# Patient Record
Sex: Female | Born: 1960
Health system: Southern US, Community
[De-identification: ages and names within clinical notes are randomized; demographics above are authoritative.]

---

## 1999-03-31 ENCOUNTER — Other Ambulatory Visit: Admission: RE | Admit: 1999-03-31 | Discharge: 1999-03-31 | Payer: Self-pay | Admitting: Obstetrics and Gynecology

## 2001-01-26 ENCOUNTER — Other Ambulatory Visit: Admission: RE | Admit: 2001-01-26 | Discharge: 2001-01-26 | Payer: Self-pay | Admitting: Obstetrics and Gynecology

## 2001-01-31 ENCOUNTER — Encounter: Admission: RE | Admit: 2001-01-31 | Discharge: 2001-01-31 | Payer: Self-pay | Admitting: Obstetrics and Gynecology

## 2001-01-31 ENCOUNTER — Encounter: Payer: Self-pay | Admitting: Obstetrics and Gynecology

## 2003-05-13 ENCOUNTER — Ambulatory Visit (HOSPITAL_COMMUNITY): Admission: RE | Admit: 2003-05-13 | Discharge: 2003-05-13 | Payer: Self-pay | Admitting: Neurosurgery

## 2005-10-18 ENCOUNTER — Ambulatory Visit: Payer: Self-pay | Admitting: Hematology and Oncology

## 2005-10-18 LAB — FERRITIN: Ferritin: 1 ng/mL — ABNORMAL LOW (ref 10–291)

## 2006-03-15 ENCOUNTER — Ambulatory Visit: Payer: Self-pay | Admitting: Hematology and Oncology

## 2006-03-15 LAB — CBC WITH DIFFERENTIAL/PLATELET
BASO%: 0.5 % (ref 0.0–2.0)
EOS%: 3 % (ref 0.0–7.0)
LYMPH%: 29.3 % (ref 14.0–48.0)
MCH: 29.5 pg (ref 26.0–34.0)
MCHC: 34.2 g/dL (ref 32.0–36.0)
MCV: 86.2 fL (ref 81.0–101.0)
MONO%: 6.8 % (ref 0.0–13.0)
Platelets: 400 10*3/uL (ref 145–400)
RBC: 4.82 10*6/uL (ref 3.70–5.32)

## 2006-08-10 ENCOUNTER — Ambulatory Visit (HOSPITAL_COMMUNITY): Admission: RE | Admit: 2006-08-10 | Discharge: 2006-08-11 | Payer: Self-pay | Admitting: Obstetrics and Gynecology

## 2006-08-10 ENCOUNTER — Encounter (INDEPENDENT_AMBULATORY_CARE_PROVIDER_SITE_OTHER): Payer: Self-pay | Admitting: *Deleted

## 2007-08-08 ENCOUNTER — Encounter: Admission: RE | Admit: 2007-08-08 | Discharge: 2007-08-08 | Payer: Self-pay | Admitting: Internal Medicine

## 2008-01-13 ENCOUNTER — Emergency Department (HOSPITAL_COMMUNITY): Admission: EM | Admit: 2008-01-13 | Discharge: 2008-01-13 | Payer: Self-pay | Admitting: Emergency Medicine

## 2008-03-26 ENCOUNTER — Ambulatory Visit (HOSPITAL_COMMUNITY): Admission: RE | Admit: 2008-03-26 | Discharge: 2008-03-26 | Payer: Self-pay | Admitting: Gastroenterology

## 2008-08-22 ENCOUNTER — Encounter: Admission: RE | Admit: 2008-08-22 | Discharge: 2008-08-22 | Payer: Self-pay | Admitting: Internal Medicine

## 2009-10-24 ENCOUNTER — Encounter: Admission: RE | Admit: 2009-10-24 | Discharge: 2009-10-24 | Payer: Self-pay | Admitting: Internal Medicine

## 2010-09-01 NOTE — Op Note (Signed)
[name], [name]                    ACCOUNT NO.:  [id number]   MEDICAL RECORD NO.:  [id number]          PATIENT TYPE:  AMB   LOCATION:  ENDO                         FACILITY:  [facility]   PHYSICIAN:  Bernette Redbird, M.D.   DATE OF BIRTH:  05/17/1960   DATE OF PROCEDURE:  03/26/2008  DATE OF DISCHARGE:                               OPERATIVE REPORT   PROCEDURE:  Colonoscopy.   INDICATIONS:  Initial colon cancer screening exam in a 50 year old Asian  immigrant with a family history of colon cancer in her sister.   FINDINGS:  Normal exam to the cecum.   DESCRIPTION OF PROCEDURE:  The nature, purpose, and risks of the  procedure had been discussed with the patient, and she provided written  consent, coming as an outpatient to the [facility].  Sedation was fentanyl 100 mcg and Versed 10 mg IV without clinical  instability.   The Pentax pediatric video colonoscope was easily advanced to the cecum,  as identified by visualization of the appendiceal orifice and the  ileocecal valve.  The tip of the scope was nubbed into the orifice of  the terminal ileum, although the TI was not freely intubated.  Pullback  was then performed.  The quality of prep was excellent and it is felt  that all areas were well-seen.   This was a normal examination.  No polyps, cancer, colitis, vascular  malformations, or diverticulosis were noted.  Retroflexion in the rectum  and re-inspection of the rectum were unremarkable.  No biopsies were  obtained.  The patient tolerated the procedure well and there were no  apparent complications.   IMPRESSION:  Normal initial screening colonoscopy in a patient with a  family history of colon cancer.   PLAN:  Repeat colonoscopy in five years.           ______________________________  Bernette Redbird, M.D.     RB/MEDQ  D:  03/26/2008  T:  03/26/2008  Job:  [id number]   cc:   Kari Baars, M.D.  Fax: [phone]

## 2010-09-04 NOTE — Op Note (Signed)
[name], [name]                    ACCOUNT NO.:  [id number]   MEDICAL RECORD NO.:  [id number]          PATIENT TYPE:  AMB   LOCATION:  SDC                           FACILITY:  [facility]   PHYSICIAN:  [name], M.D.DATE OF BIRTH:  [birth year]   DATE OF PROCEDURE:  08/10/2006  DATE OF DISCHARGE:                               OPERATIVE REPORT   PREOPERATIVE DIAGNOSIS:  1. Menorrhagia.  2. Fibroid uterus.   POSTOPERATIVE DIAGNOSIS:  1. Menorrhagia.  2  Fibroid uterus.  1. Endometriosis.   PROCEDURE:  Total abdominal hysterectomy.   SURGEON:  [name], M.D.   ASSISTANT:  [name]. Earlene Plater, M.D.   ANESTHESIA:  Epidural.   INDICATIONS:  This is a 50 year old, G1, P0-0-1-0 woman who has been  having excessively heavy menstrual periods.  The patient had an  ultrasound which revealed a 16-week size uterus.  The patient was put on  the Lupron to try to decrease the size of the uterus for 6 months.  The  patient's periods stopped.  The patient's uterus decreased slightly but  only to about 14-16 weeks size uterus.  The patient has multiple  fibroids present.  Because of the continued enlarged size of the uterus  and the location, it was decided instead of doing a laparoscopic  hysterectomy,  the patient will have a total abdominal hysterectomy.  Because of this, she was brought to the operating room for abdominal  hysterectomy.   FINDINGS:  Enlarged uterus with multiple fibroids present.  Normal  fallopian tubes and ovaries.  Endometriosis present in the anterior cul-  de-sac   DESCRIPTION OF PROCEDURE:  The patient is brought into the operating  room and given adequate epidural anesthesia.  She was placed in a frog-  leg position.  Her abdomen and vagina were washed with Betadine.  Foley  catheter was inserted into the bladder.  The patient was taken out of  frog-leg position.  She was draped in sterile fashion.  After  infiltrating with 0.5% Marcaine, incision was  made.  This was brought  down sharply to the fascia.  Bleeders were cauterized.  Fascia was  incised sharply and with cautery, fascia was elevated off of the rectus  muscles and was cauterized.  The rectus muscles were separated bluntly.  The perineum was identified and entered sharply.  The peritoneal  incision was extended both bluntly and sharply.  The bowel was packed  off with wet lap pads.  The fibroids were inspected.  The left round  ligament was identified.  It was doubly suture ligated with 0 Vicryl  ligature and incised.  The anterior leaf of the broad ligament was  incised.  The left utero-ovarian ligament was doubly clamped.  It was  suture ligated with 0 Vicryl ligature and then free tied with 0 Vicryl  free ties.  The same procedure was performed on the right.  However,  because of the anatomy the utero-ovarian ligament was clamped with a  LigaSure and cauterized to be able to take it down instead of clamping  and using  ligatures. Adequate hemostasis was present.  The uterus was  mobilized in this fashion and using a corkscrew, uterus was brought up  through the abdominal incision after having placed a Balfour retractor.  The bladder flap was developed off of the lower uterine segment with  blunt and sharp dissection and with cautery.  Adequate hemostasis was  present.  The bladder flap was dissected off the cervix with blunt  dissection.  The uterine arteries were identified and they were clamped  and cut and suture ligated with 0 Vicryl ligatures.  This was done down  to the cardinal ligaments and the uterus was dissected off of the cervix  for more mobilization and for better visualization.  This was done with  sharp scalpel.  The cervix was grasped with Kocher clamps.  The  remaining cervix was dissected free by using straight larger clamps  across the cardinal ligaments by clamping, cutting, and suture ligating  on alternating sides.  Suture ligatures were with 0  Vicryl.  The  uterosacral ligaments were clamped, cut and suture ligated.  The vagina  was entered posteriorly with a curved clamp and the cervix was dissected  off of the vagina circumferentially.  The vagina was identified and  using 0 Vicryl, a running locked whipstitch was taken with 0 Vicryl.  Adequate hemostasis was obtained in this manner.  The vagina was then  closed with 0 Vicryl figure-of-eight stitches.  Adequate hemostasis was  present.  The pelvis was then irrigated.  There was no bleeding present.  The ovaries were inspected and felt to have adequate hemostasis.  There  was no bleeding beneath the bladder flap.  All packs were removed from  the abdomen.  The small bleeders were cauterized underneath the fascia.  There was no significant bleeding here.  The fascia was then closed with  two sutures with 0 Vicryl running laterally to the midline.  Adequate  hemostasis was present.  The incision was irrigated.  The skin was then  closed with 4-0 Monocryl in a subcuticular running stitch.  The incision  had been infiltrated with more of the 0.5% Marcaine prior to closure.  Adequate hemostasis was present.  The skin was also closed with  Dermabond.  No dressing was necessary because of the Dermabond closure.  The patient was then awakened.  She was moved from the operating table  to a stretcher in stable condition.  Complications were none.  Estimated  blood loss was 150 mL.  Specimen was the uterus with fibroids and cervix  present, weight approximately 550 grams.      [name], M.D.  Electronically Signed     SAD/MEDQ  D:  08/10/2006  T:  08/10/2006  Job:  [id number]

## 2010-09-28 ENCOUNTER — Other Ambulatory Visit: Payer: Self-pay | Admitting: Internal Medicine

## 2010-09-28 DIAGNOSIS — Z1231 Encounter for screening mammogram for malignant neoplasm of breast: Secondary | ICD-10-CM

## 2010-10-26 ENCOUNTER — Ambulatory Visit
Admission: RE | Admit: 2010-10-26 | Discharge: 2010-10-26 | Disposition: A | Discharge status: Home / Self Care | Payer: 59 | Source: Ambulatory Visit | Attending: Internal Medicine | Admitting: Internal Medicine

## 2010-10-26 DIAGNOSIS — Z1231 Encounter for screening mammogram for malignant neoplasm of breast: Secondary | ICD-10-CM

## 2011-10-05 ENCOUNTER — Other Ambulatory Visit: Payer: Self-pay | Admitting: Internal Medicine

## 2011-10-05 DIAGNOSIS — Z1231 Encounter for screening mammogram for malignant neoplasm of breast: Secondary | ICD-10-CM

## 2011-11-19 ENCOUNTER — Ambulatory Visit: Payer: 59

## 2011-12-14 ENCOUNTER — Ambulatory Visit
Admission: RE | Admit: 2011-12-14 | Discharge: 2011-12-14 | Disposition: A | Discharge status: Home / Self Care | Payer: 59 | Source: Ambulatory Visit | Attending: Internal Medicine | Admitting: Internal Medicine

## 2011-12-14 DIAGNOSIS — Z1231 Encounter for screening mammogram for malignant neoplasm of breast: Secondary | ICD-10-CM

## 2012-03-20 ENCOUNTER — Other Ambulatory Visit: Payer: Self-pay | Admitting: Hematology & Oncology

## 2012-11-10 ENCOUNTER — Other Ambulatory Visit: Payer: Self-pay

## 2012-11-10 DIAGNOSIS — Z1231 Encounter for screening mammogram for malignant neoplasm of breast: Secondary | ICD-10-CM

## 2013-01-03 ENCOUNTER — Ambulatory Visit
Admission: RE | Admit: 2013-01-03 | Discharge: 2013-01-03 | Disposition: A | Discharge status: Home / Self Care | Payer: 59 | Source: Ambulatory Visit

## 2013-01-03 DIAGNOSIS — Z1231 Encounter for screening mammogram for malignant neoplasm of breast: Secondary | ICD-10-CM

## 2013-04-10 ENCOUNTER — Other Ambulatory Visit: Payer: Self-pay | Admitting: Hematology & Oncology

## 2014-01-07 ENCOUNTER — Other Ambulatory Visit: Payer: Self-pay

## 2014-01-07 DIAGNOSIS — Z1231 Encounter for screening mammogram for malignant neoplasm of breast: Secondary | ICD-10-CM

## 2014-01-16 ENCOUNTER — Ambulatory Visit
Admission: RE | Admit: 2014-01-16 | Discharge: 2014-01-16 | Disposition: A | Discharge status: Home / Self Care | Payer: 59 | Source: Ambulatory Visit

## 2014-01-16 DIAGNOSIS — Z1231 Encounter for screening mammogram for malignant neoplasm of breast: Secondary | ICD-10-CM

## 2014-06-17 ENCOUNTER — Other Ambulatory Visit: Payer: Self-pay | Admitting: Hematology & Oncology

## 2015-01-29 ENCOUNTER — Other Ambulatory Visit: Payer: Self-pay

## 2015-01-29 DIAGNOSIS — Z1231 Encounter for screening mammogram for malignant neoplasm of breast: Secondary | ICD-10-CM

## 2015-02-11 ENCOUNTER — Ambulatory Visit
Admission: RE | Admit: 2015-02-11 | Discharge: 2015-02-11 | Disposition: A | Discharge status: Home / Self Care | Payer: 59 | Source: Ambulatory Visit

## 2015-02-11 DIAGNOSIS — Z1231 Encounter for screening mammogram for malignant neoplasm of breast: Secondary | ICD-10-CM

## 2015-03-31 ENCOUNTER — Ambulatory Visit (INDEPENDENT_AMBULATORY_CARE_PROVIDER_SITE_OTHER): Payer: 59 | Admitting: Podiatry

## 2015-03-31 VITALS — BP 101/70 | HR 93 | Resp 16

## 2015-03-31 DIAGNOSIS — B351 Tinea unguium: Secondary | ICD-10-CM | POA: Diagnosis not present

## 2015-03-31 DIAGNOSIS — L600 Ingrowing nail: Secondary | ICD-10-CM

## 2015-03-31 NOTE — Progress Notes (Signed)
   Subjective:    Patient ID: [name], female    DOB: 08/19/1960, 54 y.o.   MRN: [id number]  HPI  Pt presents with left hallux nail discoloration, no pain to area  Review of Systems  All other systems reviewed and are negative.      Objective:   Physical Exam        Assessment & Plan:

## 2015-04-01 NOTE — Progress Notes (Signed)
Subjective:     Patient ID: [name], female   DOB: 03/30/1961, 54 y.o.   MRN: [id number]  HPI patient presents with discolored loose left hallux nail stating she dropped a 40 pound bag of dog food on her   Review of Systems  All other systems reviewed and are negative.      Objective:   Physical Exam  Constitutional: She is oriented to person, place, and time.  Cardiovascular: Intact distal pulses.   Musculoskeletal: Normal range of motion.  Neurological: She is oriented to person, place, and time.  Skin: Skin is warm.  Nursing note and vitals reviewed.  neurovascular status found to be intact muscle strength adequate range of motion within normal limits with patient noted to have a completely discolored left big toenail that is mildly loose distally but intact proximally with minimal discomfort and no drainage. Good digital perfusion is noted well oriented 3     Assessment:     Trauma to the left hallux nail secondary to probable direct pressure creating discoloration and underlying bleeding of the nail    Plan:     H&P condition discussed and explained possibility for nail removal in future and the fact it may not regrow normally and may develop ingrown component. Patient will be seen back for us to recheck

## 2015-06-12 MED FILL — SULINDAC 200 MG TABLET: 200 | 30 days supply | Qty: 60 | Fill #5

## 2015-08-06 ENCOUNTER — Other Ambulatory Visit: Payer: Self-pay | Admitting: Hematology & Oncology

## 2015-08-06 DIAGNOSIS — E559 Vitamin D deficiency, unspecified: Secondary | ICD-10-CM | POA: Diagnosis not present

## 2015-08-06 DIAGNOSIS — Z0000 Encounter for general adult medical examination without abnormal findings: Secondary | ICD-10-CM | POA: Diagnosis not present

## 2015-08-06 DIAGNOSIS — R7301 Impaired fasting glucose: Secondary | ICD-10-CM | POA: Diagnosis not present

## 2015-08-06 DIAGNOSIS — E784 Other hyperlipidemia: Secondary | ICD-10-CM | POA: Diagnosis not present

## 2015-08-06 MED FILL — SULINDAC 200 MG TABLET: 200 | 30 days supply | Qty: 60 | Fill #0

## 2015-08-13 DIAGNOSIS — J3089 Other allergic rhinitis: Secondary | ICD-10-CM | POA: Diagnosis not present

## 2015-08-13 DIAGNOSIS — Z01419 Encounter for gynecological examination (general) (routine) without abnormal findings: Secondary | ICD-10-CM | POA: Diagnosis not present

## 2015-08-13 DIAGNOSIS — Z1389 Encounter for screening for other disorder: Secondary | ICD-10-CM | POA: Diagnosis not present

## 2015-08-13 DIAGNOSIS — Z0000 Encounter for general adult medical examination without abnormal findings: Secondary | ICD-10-CM | POA: Diagnosis not present

## 2015-08-13 DIAGNOSIS — E784 Other hyperlipidemia: Secondary | ICD-10-CM | POA: Diagnosis not present

## 2015-08-13 DIAGNOSIS — E559 Vitamin D deficiency, unspecified: Secondary | ICD-10-CM | POA: Diagnosis not present

## 2015-08-13 DIAGNOSIS — M19041 Primary osteoarthritis, right hand: Secondary | ICD-10-CM | POA: Diagnosis not present

## 2015-08-13 DIAGNOSIS — R7301 Impaired fasting glucose: Secondary | ICD-10-CM | POA: Diagnosis not present

## 2015-08-13 DIAGNOSIS — R030 Elevated blood-pressure reading, without diagnosis of hypertension: Secondary | ICD-10-CM | POA: Diagnosis not present

## 2015-08-13 DIAGNOSIS — Z800 Family history of malignant neoplasm of digestive organs: Secondary | ICD-10-CM | POA: Diagnosis not present

## 2015-08-13 MED FILL — HYDROCODONE-CHLORPHENIRAM S: 10-8 | 30 days supply | Qty: 150 | Fill #0

## 2015-11-09 MED FILL — SULINDAC 200 MG TABLET: 200 | 30 days supply | Qty: 60 | Fill #1

## 2016-02-04 ENCOUNTER — Other Ambulatory Visit: Payer: Self-pay | Admitting: Internal Medicine

## 2016-02-04 DIAGNOSIS — Z1231 Encounter for screening mammogram for malignant neoplasm of breast: Secondary | ICD-10-CM

## 2016-02-09 MED FILL — SULINDAC 200 MG TABLET: 200 | 30 days supply | Qty: 60 | Fill #2

## 2016-02-16 ENCOUNTER — Other Ambulatory Visit: Payer: Self-pay

## 2016-02-17 DIAGNOSIS — R0789 Other chest pain: Secondary | ICD-10-CM | POA: Diagnosis not present

## 2016-02-17 DIAGNOSIS — M19041 Primary osteoarthritis, right hand: Secondary | ICD-10-CM | POA: Diagnosis not present

## 2016-02-17 DIAGNOSIS — R079 Chest pain, unspecified: Secondary | ICD-10-CM | POA: Diagnosis not present

## 2016-02-17 DIAGNOSIS — Z6828 Body mass index (BMI) 28.0-28.9, adult: Secondary | ICD-10-CM | POA: Diagnosis not present

## 2016-02-27 ENCOUNTER — Ambulatory Visit
Admission: RE | Admit: 2016-02-27 | Discharge: 2016-02-27 | Disposition: A | Discharge status: Home / Self Care | Payer: 59 | Source: Ambulatory Visit | Attending: Internal Medicine | Admitting: Internal Medicine

## 2016-02-27 DIAGNOSIS — Z1231 Encounter for screening mammogram for malignant neoplasm of breast: Secondary | ICD-10-CM | POA: Diagnosis not present

## 2016-04-02 ENCOUNTER — Encounter: Payer: Self-pay | Admitting: *Deleted

## 2016-04-23 MED FILL — SULINDAC 200 MG TABLET: 200 | 30 days supply | Qty: 60 | Fill #3

## 2016-07-10 MED FILL — SULINDAC 200 MG TABLET: 200 | 30 days supply | Qty: 60 | Fill #4

## 2016-07-19 DIAGNOSIS — H52203 Unspecified astigmatism, bilateral: Secondary | ICD-10-CM | POA: Diagnosis not present

## 2016-08-11 DIAGNOSIS — R7301 Impaired fasting glucose: Secondary | ICD-10-CM | POA: Diagnosis not present

## 2016-08-11 DIAGNOSIS — E559 Vitamin D deficiency, unspecified: Secondary | ICD-10-CM | POA: Diagnosis not present

## 2016-08-11 DIAGNOSIS — Z0000 Encounter for general adult medical examination without abnormal findings: Secondary | ICD-10-CM | POA: Diagnosis not present

## 2016-08-11 DIAGNOSIS — N390 Urinary tract infection, site not specified: Secondary | ICD-10-CM | POA: Diagnosis not present

## 2016-08-18 DIAGNOSIS — R030 Elevated blood-pressure reading, without diagnosis of hypertension: Secondary | ICD-10-CM | POA: Diagnosis not present

## 2016-08-18 DIAGNOSIS — J3089 Other allergic rhinitis: Secondary | ICD-10-CM | POA: Diagnosis not present

## 2016-08-18 DIAGNOSIS — Z800 Family history of malignant neoplasm of digestive organs: Secondary | ICD-10-CM | POA: Diagnosis not present

## 2016-08-18 DIAGNOSIS — Z1389 Encounter for screening for other disorder: Secondary | ICD-10-CM | POA: Diagnosis not present

## 2016-08-18 DIAGNOSIS — Z6829 Body mass index (BMI) 29.0-29.9, adult: Secondary | ICD-10-CM | POA: Diagnosis not present

## 2016-08-18 DIAGNOSIS — E784 Other hyperlipidemia: Secondary | ICD-10-CM | POA: Diagnosis not present

## 2016-08-18 DIAGNOSIS — Z01419 Encounter for gynecological examination (general) (routine) without abnormal findings: Secondary | ICD-10-CM | POA: Diagnosis not present

## 2016-08-18 DIAGNOSIS — M19041 Primary osteoarthritis, right hand: Secondary | ICD-10-CM | POA: Diagnosis not present

## 2016-08-18 DIAGNOSIS — E559 Vitamin D deficiency, unspecified: Secondary | ICD-10-CM | POA: Diagnosis not present

## 2016-08-18 DIAGNOSIS — R7301 Impaired fasting glucose: Secondary | ICD-10-CM | POA: Diagnosis not present

## 2016-08-18 DIAGNOSIS — Z0000 Encounter for general adult medical examination without abnormal findings: Secondary | ICD-10-CM | POA: Diagnosis not present

## 2016-09-01 ENCOUNTER — Other Ambulatory Visit: Payer: Self-pay | Admitting: Hematology & Oncology

## 2016-09-01 MED FILL — SULINDAC 200 MG TABLET: 200 | 30 days supply | Qty: 60 | Fill #0

## 2016-11-19 ENCOUNTER — Other Ambulatory Visit: Payer: Self-pay | Admitting: Internal Medicine

## 2016-11-19 DIAGNOSIS — Z1231 Encounter for screening mammogram for malignant neoplasm of breast: Secondary | ICD-10-CM

## 2016-12-21 MED FILL — SULINDAC 200 MG TABLET: 200 | 30 days supply | Qty: 60 | Fill #1

## 2017-02-17 DIAGNOSIS — Z6828 Body mass index (BMI) 28.0-28.9, adult: Secondary | ICD-10-CM | POA: Diagnosis not present

## 2017-02-17 DIAGNOSIS — J069 Acute upper respiratory infection, unspecified: Secondary | ICD-10-CM | POA: Diagnosis not present

## 2017-02-17 DIAGNOSIS — R05 Cough: Secondary | ICD-10-CM | POA: Diagnosis not present

## 2017-02-17 MED FILL — AZITHROMYCIN 250 MG TAB: 250 | 5 days supply | Qty: 6 | Fill #0

## 2017-02-17 MED FILL — HYDROCODONE-CHLORPHENIRAM S: 10-8 | 12 days supply | Qty: 120 | Fill #0

## 2017-03-04 ENCOUNTER — Ambulatory Visit
Admission: RE | Admit: 2017-03-04 | Discharge: 2017-03-04 | Disposition: A | Discharge status: Home / Self Care | Payer: 59 | Source: Ambulatory Visit | Attending: Internal Medicine | Admitting: Internal Medicine

## 2017-03-04 DIAGNOSIS — Z1231 Encounter for screening mammogram for malignant neoplasm of breast: Secondary | ICD-10-CM | POA: Diagnosis not present

## 2017-03-08 MED FILL — SULINDAC 200 MG TABLET: 200 | 30 days supply | Qty: 60 | Fill #2

## 2017-05-13 MED FILL — SULINDAC 200 MG TABLET: 200 | 30 days supply | Qty: 60 | Fill #3

## 2017-07-19 MED FILL — SULINDAC 200 MG TABLET: 200 | 30 days supply | Qty: 60 | Fill #4

## 2017-08-24 DIAGNOSIS — R7301 Impaired fasting glucose: Secondary | ICD-10-CM | POA: Diagnosis not present

## 2017-08-24 DIAGNOSIS — Z0000 Encounter for general adult medical examination without abnormal findings: Secondary | ICD-10-CM | POA: Diagnosis not present

## 2017-08-24 DIAGNOSIS — E559 Vitamin D deficiency, unspecified: Secondary | ICD-10-CM | POA: Diagnosis not present

## 2017-08-24 DIAGNOSIS — R82998 Other abnormal findings in urine: Secondary | ICD-10-CM | POA: Diagnosis not present

## 2017-08-31 DIAGNOSIS — R030 Elevated blood-pressure reading, without diagnosis of hypertension: Secondary | ICD-10-CM | POA: Diagnosis not present

## 2017-08-31 DIAGNOSIS — E7849 Other hyperlipidemia: Secondary | ICD-10-CM | POA: Diagnosis not present

## 2017-08-31 DIAGNOSIS — Z800 Family history of malignant neoplasm of digestive organs: Secondary | ICD-10-CM | POA: Diagnosis not present

## 2017-08-31 DIAGNOSIS — J309 Allergic rhinitis, unspecified: Secondary | ICD-10-CM | POA: Diagnosis not present

## 2017-08-31 DIAGNOSIS — Z6829 Body mass index (BMI) 29.0-29.9, adult: Secondary | ICD-10-CM | POA: Diagnosis not present

## 2017-08-31 DIAGNOSIS — M19041 Primary osteoarthritis, right hand: Secondary | ICD-10-CM | POA: Diagnosis not present

## 2017-08-31 DIAGNOSIS — E559 Vitamin D deficiency, unspecified: Secondary | ICD-10-CM | POA: Diagnosis not present

## 2017-08-31 DIAGNOSIS — Z1151 Encounter for screening for human papillomavirus (HPV): Secondary | ICD-10-CM | POA: Diagnosis not present

## 2017-08-31 DIAGNOSIS — R7301 Impaired fasting glucose: Secondary | ICD-10-CM | POA: Diagnosis not present

## 2017-08-31 DIAGNOSIS — Z01419 Encounter for gynecological examination (general) (routine) without abnormal findings: Secondary | ICD-10-CM | POA: Diagnosis not present

## 2017-08-31 DIAGNOSIS — Z1389 Encounter for screening for other disorder: Secondary | ICD-10-CM | POA: Diagnosis not present

## 2017-08-31 DIAGNOSIS — Z0000 Encounter for general adult medical examination without abnormal findings: Secondary | ICD-10-CM | POA: Diagnosis not present

## 2017-08-31 MED FILL — SHINGRIX 50 MCG SUS: 50 | 1 days supply | Qty: 1 | Fill #0

## 2017-10-18 ENCOUNTER — Other Ambulatory Visit: Payer: Self-pay | Admitting: Hematology & Oncology

## 2017-10-25 MED FILL — SULINDAC TAB 200 MG: 200 | 30 days supply | Qty: 60 | Fill #0

## 2017-11-21 MED FILL — SULINDAC TAB 200 MG: 200 | 30 days supply | Qty: 60 | Fill #1

## 2017-11-21 MED FILL — SHINGRIX 50 MCG SUS: 50 | 1 days supply | Qty: 1 | Fill #1

## 2018-02-15 ENCOUNTER — Other Ambulatory Visit: Payer: Self-pay | Admitting: Internal Medicine

## 2018-02-15 DIAGNOSIS — Z1231 Encounter for screening mammogram for malignant neoplasm of breast: Secondary | ICD-10-CM

## 2018-03-01 MED FILL — SULINDAC 200 MG TABS: 200 | 30 days supply | Qty: 60 | Fill #2

## 2018-04-02 MED FILL — SULINDAC 200 MG TABS: 200 | 30 days supply | Qty: 60 | Fill #3

## 2018-04-04 ENCOUNTER — Ambulatory Visit
Admission: RE | Admit: 2018-04-04 | Discharge: 2018-04-04 | Disposition: A | Discharge status: Home / Self Care | Payer: 59 | Source: Ambulatory Visit | Attending: Internal Medicine | Admitting: Internal Medicine

## 2018-04-04 DIAGNOSIS — Z1231 Encounter for screening mammogram for malignant neoplasm of breast: Secondary | ICD-10-CM

## 2018-06-29 MED FILL — SULINDAC 200 MG TABS: 200 | 30 days supply | Qty: 60 | Fill #4

## 2018-08-31 DIAGNOSIS — E559 Vitamin D deficiency, unspecified: Secondary | ICD-10-CM | POA: Diagnosis not present

## 2018-08-31 DIAGNOSIS — E785 Hyperlipidemia, unspecified: Secondary | ICD-10-CM | POA: Diagnosis not present

## 2018-08-31 DIAGNOSIS — Z008 Encounter for other general examination: Secondary | ICD-10-CM | POA: Diagnosis not present

## 2018-08-31 DIAGNOSIS — R7301 Impaired fasting glucose: Secondary | ICD-10-CM | POA: Diagnosis not present

## 2018-08-31 DIAGNOSIS — R030 Elevated blood-pressure reading, without diagnosis of hypertension: Secondary | ICD-10-CM | POA: Diagnosis not present

## 2018-09-08 ENCOUNTER — Other Ambulatory Visit: Payer: Self-pay | Admitting: Internal Medicine

## 2018-09-08 DIAGNOSIS — Z1331 Encounter for screening for depression: Secondary | ICD-10-CM | POA: Diagnosis not present

## 2018-09-08 DIAGNOSIS — Z800 Family history of malignant neoplasm of digestive organs: Secondary | ICD-10-CM | POA: Diagnosis not present

## 2018-09-08 DIAGNOSIS — Z0000 Encounter for general adult medical examination without abnormal findings: Secondary | ICD-10-CM | POA: Diagnosis not present

## 2018-09-08 DIAGNOSIS — E559 Vitamin D deficiency, unspecified: Secondary | ICD-10-CM | POA: Diagnosis not present

## 2018-09-08 DIAGNOSIS — M19041 Primary osteoarthritis, right hand: Secondary | ICD-10-CM | POA: Diagnosis not present

## 2018-09-08 DIAGNOSIS — J309 Allergic rhinitis, unspecified: Secondary | ICD-10-CM | POA: Diagnosis not present

## 2018-09-08 DIAGNOSIS — R7301 Impaired fasting glucose: Secondary | ICD-10-CM | POA: Diagnosis not present

## 2018-09-08 DIAGNOSIS — E785 Hyperlipidemia, unspecified: Secondary | ICD-10-CM | POA: Diagnosis not present

## 2018-09-08 DIAGNOSIS — R030 Elevated blood-pressure reading, without diagnosis of hypertension: Secondary | ICD-10-CM | POA: Diagnosis not present

## 2018-09-08 DIAGNOSIS — Z1231 Encounter for screening mammogram for malignant neoplasm of breast: Secondary | ICD-10-CM

## 2018-09-27 MED FILL — SULINDAC 200 MG TABS: 200 | 30 days supply | Qty: 60 | Fill #5

## 2018-09-29 DIAGNOSIS — Z01419 Encounter for gynecological examination (general) (routine) without abnormal findings: Secondary | ICD-10-CM | POA: Diagnosis not present

## 2018-09-29 DIAGNOSIS — Z1151 Encounter for screening for human papillomavirus (HPV): Secondary | ICD-10-CM | POA: Diagnosis not present

## 2018-09-29 DIAGNOSIS — Z6827 Body mass index (BMI) 27.0-27.9, adult: Secondary | ICD-10-CM | POA: Diagnosis not present

## 2018-10-27 DIAGNOSIS — H5203 Hypermetropia, bilateral: Secondary | ICD-10-CM | POA: Diagnosis not present

## 2018-12-12 MED FILL — SULINDAC 200 MG TABS: 200 | 30 days supply | Qty: 60 | Fill #0

## 2018-12-20 MED FILL — SULINDAC 200 MG TABS: 200 | 30 days supply | Qty: 60 | Fill #0

## 2019-03-05 MED FILL — SULINDAC 200 MG TABS: 200 | 30 days supply | Qty: 60 | Fill #1

## 2019-04-06 ENCOUNTER — Other Ambulatory Visit: Payer: Self-pay

## 2019-04-06 ENCOUNTER — Ambulatory Visit
Admission: RE | Admit: 2019-04-06 | Discharge: 2019-04-06 | Disposition: A | Discharge status: Home / Self Care | Payer: 59 | Source: Ambulatory Visit | Attending: Internal Medicine | Admitting: Internal Medicine

## 2019-04-06 DIAGNOSIS — Z1231 Encounter for screening mammogram for malignant neoplasm of breast: Secondary | ICD-10-CM | POA: Diagnosis not present

## 2019-04-10 DIAGNOSIS — W5503XA Scratched by cat, initial encounter: Secondary | ICD-10-CM | POA: Diagnosis not present

## 2019-04-10 DIAGNOSIS — R229 Localized swelling, mass and lump, unspecified: Secondary | ICD-10-CM | POA: Diagnosis not present

## 2019-04-10 MED FILL — MUPIROCIN 2% OINTMENT: 2 | 7 days supply | Qty: 22 | Fill #0

## 2019-04-10 MED FILL — AMOX-CLAV 875-125 MG TABLET: 875-125 | 7 days supply | Qty: 14 | Fill #0

## 2019-07-13 DIAGNOSIS — Z1159 Encounter for screening for other viral diseases: Secondary | ICD-10-CM | POA: Diagnosis not present

## 2019-07-13 MED FILL — PEG-3350 SOLUTION: 420 | 1 days supply | Qty: 4000 | Fill #0

## 2019-07-16 DIAGNOSIS — M8589 Other specified disorders of bone density and structure, multiple sites: Secondary | ICD-10-CM | POA: Diagnosis not present

## 2019-07-18 DIAGNOSIS — K635 Polyp of colon: Secondary | ICD-10-CM | POA: Diagnosis not present

## 2019-07-18 DIAGNOSIS — Z800 Family history of malignant neoplasm of digestive organs: Secondary | ICD-10-CM | POA: Diagnosis not present

## 2019-07-18 DIAGNOSIS — Z86010 Personal history of colonic polyps: Secondary | ICD-10-CM | POA: Diagnosis not present

## 2019-07-30 MED FILL — SULINDAC 200 MG TABLET: 200 | 30 days supply | Qty: 60 | Fill #2

## 2020-03-03 ENCOUNTER — Other Ambulatory Visit: Payer: Self-pay | Admitting: Internal Medicine

## 2020-03-03 DIAGNOSIS — Z1231 Encounter for screening mammogram for malignant neoplasm of breast: Secondary | ICD-10-CM

## 2020-05-23 ENCOUNTER — Ambulatory Visit: Payer: 59

## 2020-05-30 ENCOUNTER — Ambulatory Visit
Admission: RE | Admit: 2020-05-30 | Discharge: 2020-05-30 | Disposition: A | Discharge status: Home / Self Care | Payer: BC Managed Care – PPO | Source: Ambulatory Visit | Attending: Internal Medicine | Admitting: Internal Medicine

## 2020-05-30 ENCOUNTER — Other Ambulatory Visit: Payer: Self-pay

## 2020-05-30 DIAGNOSIS — Z1231 Encounter for screening mammogram for malignant neoplasm of breast: Secondary | ICD-10-CM

## 2020-12-25 IMAGING — MG DIGITAL SCREENING BILAT W/ TOMO W/ CAD
8 series · 9 of 24 positions shown · non-contrast
Comparison: Previous exam(s).

CLINICAL DATA: Screening.

EXAM:
DIGITAL SCREENING BILATERAL MAMMOGRAM WITH TOMO AND CAD

[L CC synth-2D]
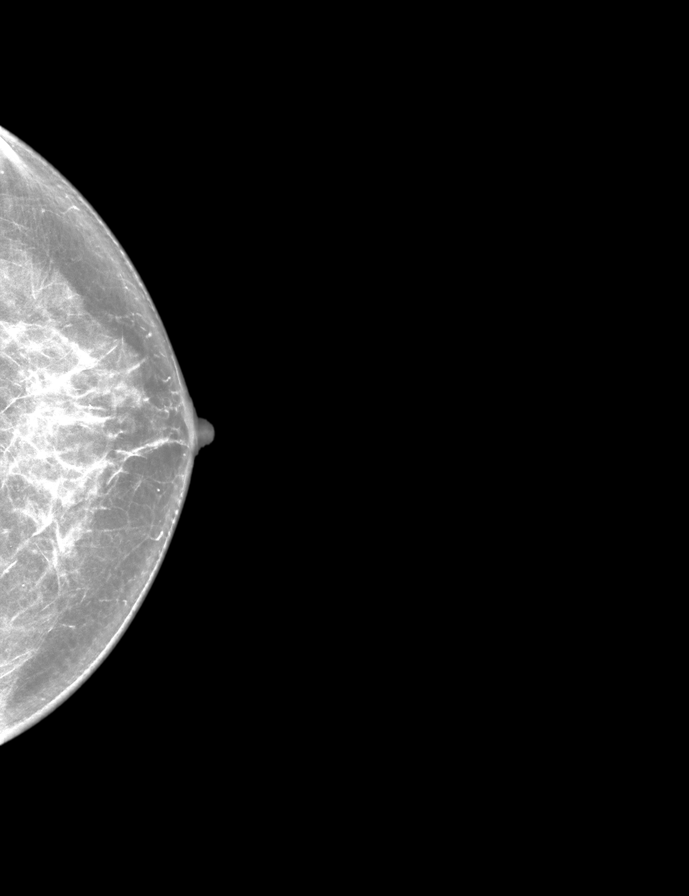

[R MLO synth-2D]
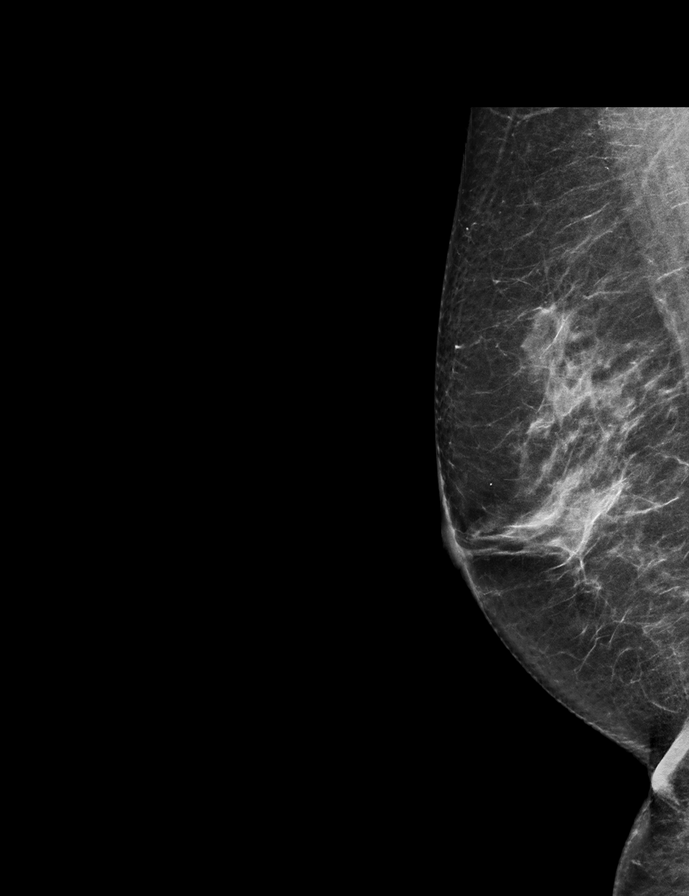

[L MLO synth-2D]
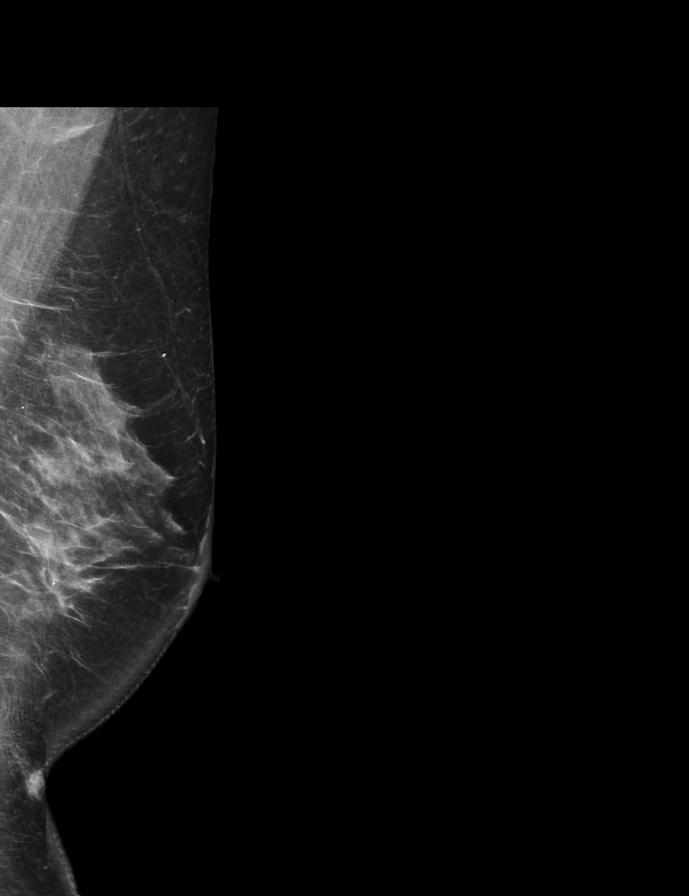

[R CC synth-2D]
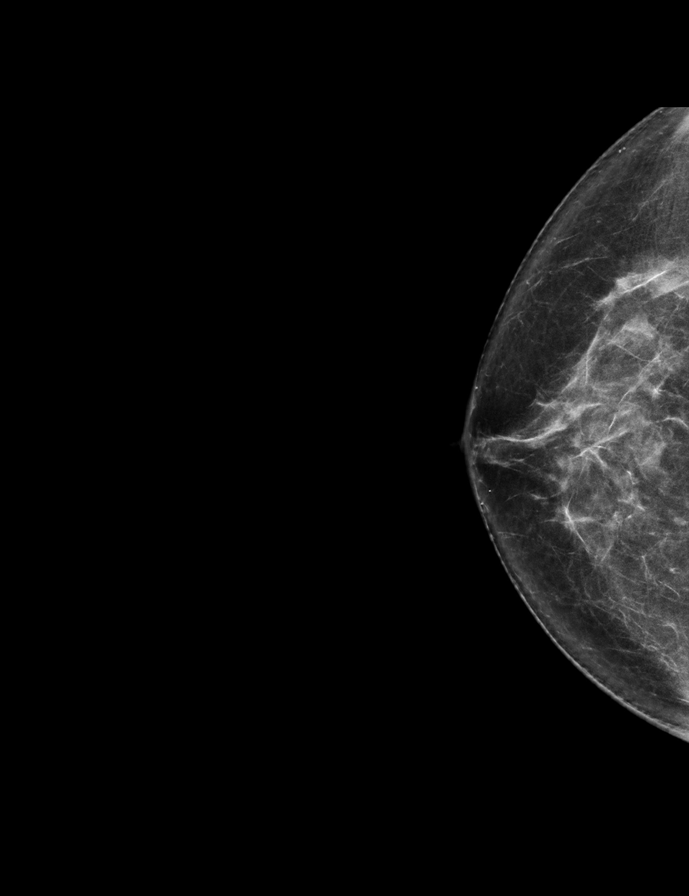

[R CC tomo · 2 of 72 frames shown]
[frame 24/72]
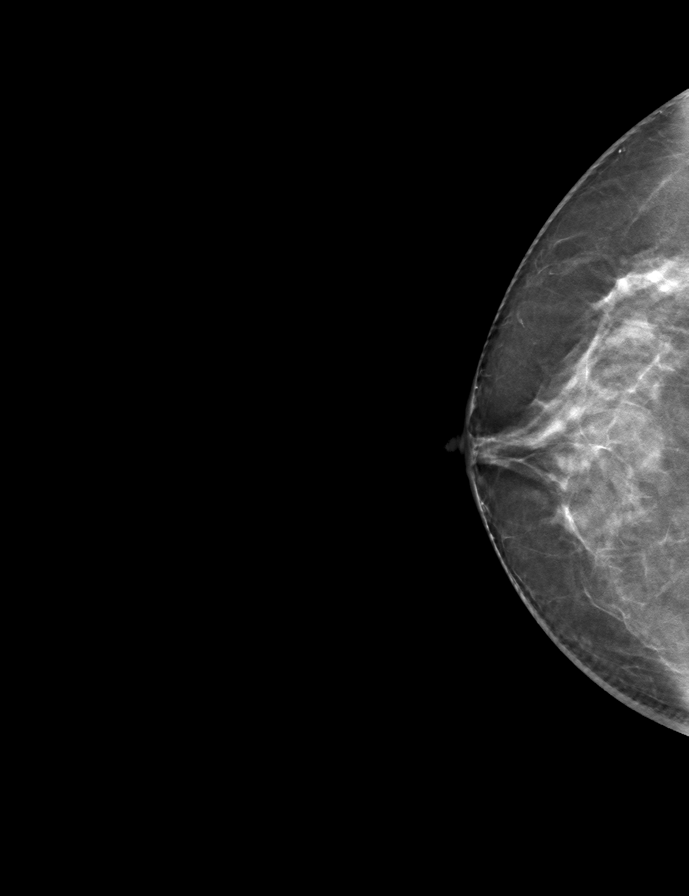
[frame 37/72]
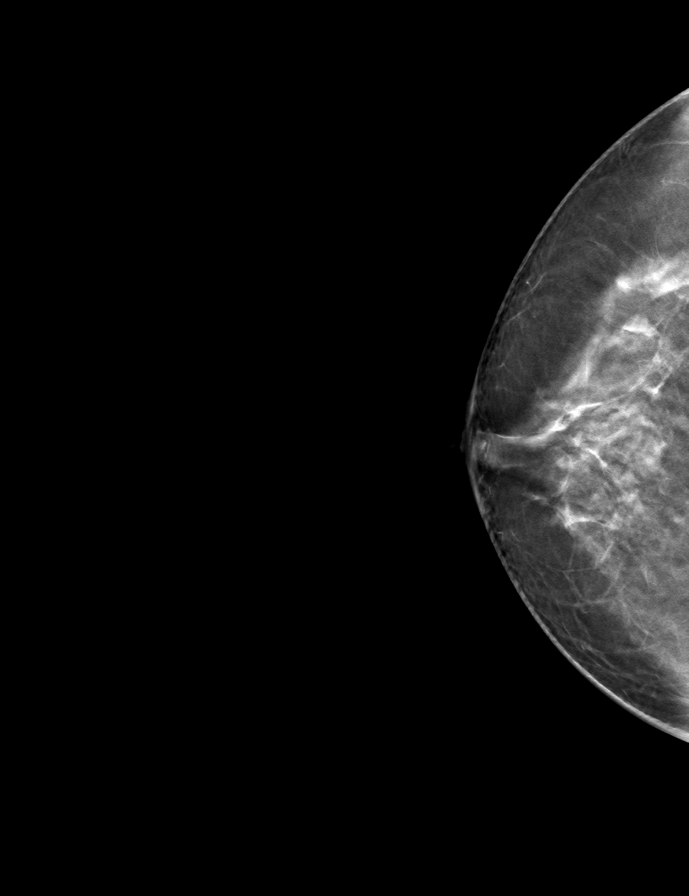

[R MLO tomo · tomo slice 33/66.0]
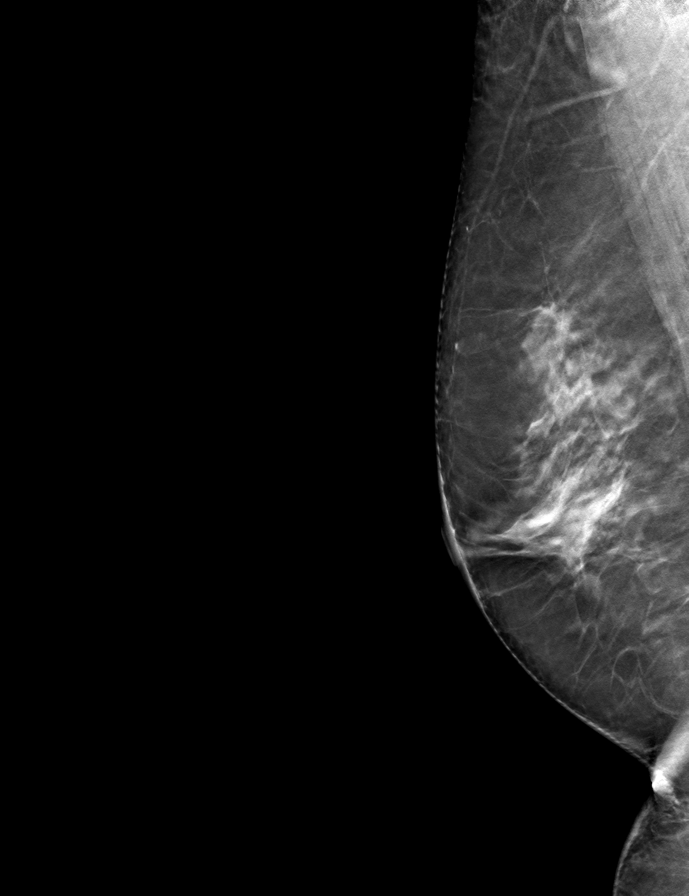

[L CC tomo · tomo slice 33/65.0]
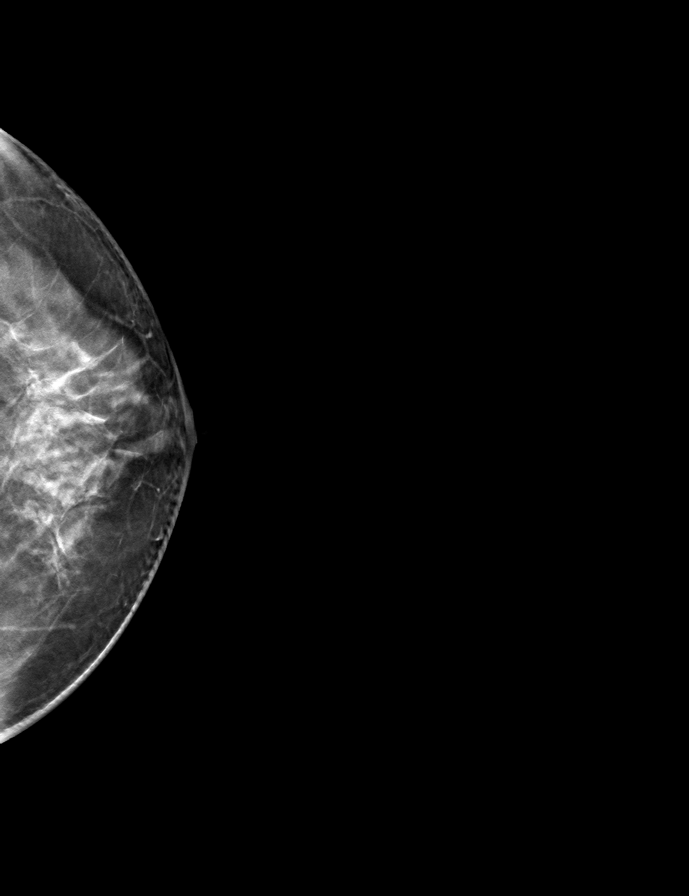

[L MLO tomo · tomo slice 38/75.0]
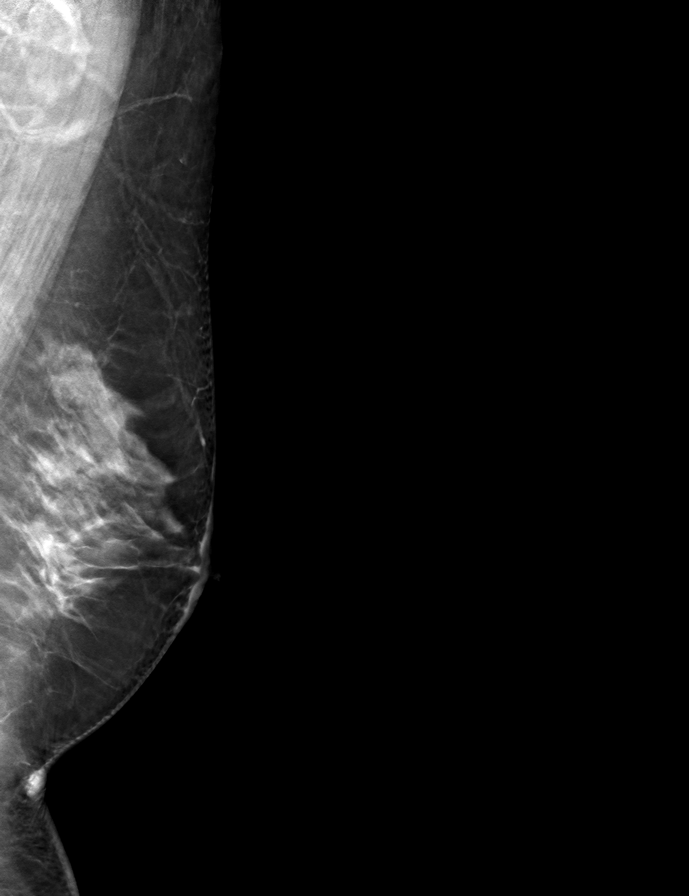

[9 of 24 positions shown; findings below may reference images not displayed]

ACR Breast Density Category c: The breast tissue is heterogeneously
dense, which may obscure small masses.
FINDINGS: There are no findings suspicious for malignancy. Images were
processed with CAD.
IMPRESSION: No mammographic evidence of malignancy. A result letter of this
screening mammogram will be mailed directly to the patient.

RECOMMENDATION:
Screening mammogram in one year. (Code:FT-U-LHB)

BI-RADS CATEGORY  1: Negative.

## 2021-01-05 ENCOUNTER — Other Ambulatory Visit (HOSPITAL_COMMUNITY): Payer: Self-pay

## 2021-01-05 MED ORDER — HYDROCOD POLST-CPM POLST ER 10-8 MG/5ML PO SUER
ORAL | 0 refills | Status: AC
  Filled 2021-01-05: qty 150, 30d supply, fill #0

## 2021-04-28 ENCOUNTER — Other Ambulatory Visit: Payer: Self-pay | Admitting: Internal Medicine

## 2021-04-28 DIAGNOSIS — Z1231 Encounter for screening mammogram for malignant neoplasm of breast: Secondary | ICD-10-CM

## 2021-06-10 ENCOUNTER — Ambulatory Visit
Admission: RE | Admit: 2021-06-10 | Discharge: 2021-06-10 | Disposition: A | Discharge status: Home / Self Care | Payer: BC Managed Care – PPO | Source: Ambulatory Visit | Attending: Internal Medicine | Admitting: Internal Medicine

## 2021-06-10 ENCOUNTER — Other Ambulatory Visit: Payer: Self-pay | Admitting: Obstetrics

## 2021-06-10 ENCOUNTER — Ambulatory Visit: Payer: BC Managed Care – PPO

## 2021-06-10 DIAGNOSIS — Z1231 Encounter for screening mammogram for malignant neoplasm of breast: Secondary | ICD-10-CM

## 2021-06-12 ENCOUNTER — Other Ambulatory Visit: Payer: Self-pay | Admitting: Obstetrics

## 2021-06-12 DIAGNOSIS — R928 Other abnormal and inconclusive findings on diagnostic imaging of breast: Secondary | ICD-10-CM

## 2021-06-15 ENCOUNTER — Ambulatory Visit: Payer: BC Managed Care – PPO

## 2021-07-07 ENCOUNTER — Ambulatory Visit: Payer: BC Managed Care – PPO

## 2021-07-07 ENCOUNTER — Ambulatory Visit
Admission: RE | Admit: 2021-07-07 | Discharge: 2021-07-07 | Disposition: A | Discharge status: Home / Self Care | Payer: BC Managed Care – PPO | Source: Ambulatory Visit | Attending: Obstetrics | Admitting: Obstetrics

## 2021-07-07 DIAGNOSIS — R928 Other abnormal and inconclusive findings on diagnostic imaging of breast: Secondary | ICD-10-CM

## 2021-11-19 ENCOUNTER — Other Ambulatory Visit (HOSPITAL_COMMUNITY): Payer: Self-pay

## 2021-11-19 MED ORDER — SULINDAC 200 MG PO TABS
ORAL_TABLET | ORAL | 11 refills | Status: DC
  Filled 2021-11-19: qty 40, 20d supply, fill #0
  Filled 2021-12-06: qty 40, 20d supply, fill #1
  Filled 2021-12-29: qty 6, 3d supply, fill #2
  Filled 2022-01-01: qty 54, 27d supply, fill #2
  Filled 2022-02-15: qty 60, 30d supply, fill #3
  Filled 2022-04-27: qty 60, 30d supply, fill #4
  Filled 2022-06-19 – 2022-06-28 (×2): qty 60, 30d supply, fill #5
  Filled 2022-08-04 (×2): qty 60, 30d supply, fill #6
  Filled 2022-10-01: qty 60, 30d supply, fill #7

## 2021-11-20 ENCOUNTER — Other Ambulatory Visit (HOSPITAL_COMMUNITY): Payer: Self-pay

## 2021-12-08 ENCOUNTER — Other Ambulatory Visit (HOSPITAL_COMMUNITY): Payer: Self-pay

## 2021-12-29 ENCOUNTER — Other Ambulatory Visit (HOSPITAL_COMMUNITY): Payer: Self-pay

## 2021-12-31 ENCOUNTER — Other Ambulatory Visit (HOSPITAL_COMMUNITY): Payer: Self-pay

## 2022-01-01 ENCOUNTER — Other Ambulatory Visit (HOSPITAL_COMMUNITY): Payer: Self-pay

## 2022-01-02 ENCOUNTER — Other Ambulatory Visit (HOSPITAL_COMMUNITY): Payer: Self-pay

## 2022-02-16 ENCOUNTER — Other Ambulatory Visit (HOSPITAL_COMMUNITY): Payer: Self-pay

## 2022-02-17 ENCOUNTER — Other Ambulatory Visit (HOSPITAL_COMMUNITY): Payer: Self-pay

## 2022-02-27 ENCOUNTER — Other Ambulatory Visit (HOSPITAL_COMMUNITY): Payer: Self-pay

## 2022-05-03 ENCOUNTER — Other Ambulatory Visit (HOSPITAL_COMMUNITY): Payer: Self-pay

## 2022-05-08 ENCOUNTER — Other Ambulatory Visit (HOSPITAL_COMMUNITY): Payer: Self-pay

## 2022-05-10 ENCOUNTER — Other Ambulatory Visit: Payer: Self-pay | Admitting: Obstetrics

## 2022-05-10 DIAGNOSIS — Z1231 Encounter for screening mammogram for malignant neoplasm of breast: Secondary | ICD-10-CM

## 2022-06-19 ENCOUNTER — Other Ambulatory Visit (HOSPITAL_COMMUNITY): Payer: Self-pay

## 2022-06-28 ENCOUNTER — Other Ambulatory Visit (HOSPITAL_COMMUNITY): Payer: Self-pay

## 2022-07-14 ENCOUNTER — Ambulatory Visit
Admission: RE | Admit: 2022-07-14 | Discharge: 2022-07-14 | Disposition: A | Discharge status: Home / Self Care | Payer: BC Managed Care – PPO | Source: Ambulatory Visit | Attending: Obstetrics | Admitting: Obstetrics

## 2022-07-14 DIAGNOSIS — Z1231 Encounter for screening mammogram for malignant neoplasm of breast: Secondary | ICD-10-CM

## 2022-07-16 ENCOUNTER — Other Ambulatory Visit: Payer: Self-pay | Admitting: Obstetrics

## 2022-07-16 DIAGNOSIS — R928 Other abnormal and inconclusive findings on diagnostic imaging of breast: Secondary | ICD-10-CM

## 2022-08-04 ENCOUNTER — Ambulatory Visit
Admission: RE | Admit: 2022-08-04 | Discharge: 2022-08-04 | Disposition: A | Discharge status: Home / Self Care | Payer: BC Managed Care – PPO | Source: Ambulatory Visit | Attending: Obstetrics | Admitting: Obstetrics

## 2022-08-04 ENCOUNTER — Other Ambulatory Visit (HOSPITAL_COMMUNITY): Payer: Self-pay

## 2022-08-04 ENCOUNTER — Encounter (HOSPITAL_COMMUNITY): Payer: Self-pay

## 2022-08-04 ENCOUNTER — Other Ambulatory Visit: Payer: Self-pay

## 2022-08-04 ENCOUNTER — Ambulatory Visit: Payer: BC Managed Care – PPO

## 2022-08-04 DIAGNOSIS — R928 Other abnormal and inconclusive findings on diagnostic imaging of breast: Secondary | ICD-10-CM

## 2022-08-05 ENCOUNTER — Other Ambulatory Visit (HOSPITAL_COMMUNITY): Payer: Self-pay

## 2022-08-05 ENCOUNTER — Other Ambulatory Visit: Payer: Self-pay

## 2022-10-02 ENCOUNTER — Other Ambulatory Visit (HOSPITAL_COMMUNITY): Payer: Self-pay

## 2022-12-16 ENCOUNTER — Other Ambulatory Visit (HOSPITAL_COMMUNITY): Payer: Self-pay

## 2022-12-16 MED ORDER — HYDROCOD POLI-CHLORPHE POLI ER 10-8 MG/5ML PO SUER
5.0000 mL | Freq: Two times a day (BID) | ORAL | 0 refills | Status: AC | PRN
  Filled 2022-12-16: qty 150, 15d supply, fill #0

## 2022-12-16 MED ORDER — AZITHROMYCIN 250 MG PO TABS
ORAL_TABLET | ORAL | 0 refills | Status: AC
  Filled 2022-12-16: qty 6, 5d supply, fill #0

## 2022-12-18 ENCOUNTER — Other Ambulatory Visit (HOSPITAL_COMMUNITY): Payer: Self-pay

## 2023-02-03 ENCOUNTER — Other Ambulatory Visit (HOSPITAL_COMMUNITY): Payer: Self-pay

## 2023-02-03 MED ORDER — AMOXICILLIN 500 MG PO CAPS
500.0000 mg | ORAL_CAPSULE | Freq: Three times a day (TID) | ORAL | 1 refills | Status: AC
  Filled 2023-02-03: qty 21, 7d supply, fill #0
  Filled 2023-02-15: qty 21, 7d supply, fill #1

## 2023-02-15 ENCOUNTER — Other Ambulatory Visit (HOSPITAL_COMMUNITY): Payer: Self-pay

## 2023-02-16 ENCOUNTER — Other Ambulatory Visit (HOSPITAL_COMMUNITY): Payer: Self-pay

## 2023-03-12 ENCOUNTER — Other Ambulatory Visit (HOSPITAL_COMMUNITY): Payer: Self-pay

## 2023-03-14 ENCOUNTER — Other Ambulatory Visit (HOSPITAL_COMMUNITY): Payer: Self-pay

## 2023-03-14 MED ORDER — SULINDAC 200 MG PO TABS
200.0000 mg | ORAL_TABLET | Freq: Two times a day (BID) | ORAL | 11 refills | Status: AC | PRN
  Filled 2023-03-14: qty 60, 30d supply, fill #0
  Filled 2023-06-26 – 2023-07-09 (×2): qty 60, 30d supply, fill #1
  Filled 2023-10-02 – 2023-10-14 (×2): qty 60, 30d supply, fill #2
  Filled 2023-11-19: qty 60, 30d supply, fill #3
  Filled 2024-02-26: qty 60, 30d supply, fill #4

## 2023-03-19 ENCOUNTER — Other Ambulatory Visit (HOSPITAL_COMMUNITY): Payer: Self-pay

## 2023-06-30 ENCOUNTER — Other Ambulatory Visit (HOSPITAL_COMMUNITY): Payer: Self-pay

## 2023-07-08 ENCOUNTER — Other Ambulatory Visit (HOSPITAL_COMMUNITY): Payer: Self-pay

## 2023-07-09 ENCOUNTER — Other Ambulatory Visit (HOSPITAL_COMMUNITY): Payer: Self-pay

## 2023-07-28 ENCOUNTER — Other Ambulatory Visit (HOSPITAL_COMMUNITY): Payer: Self-pay

## 2023-10-05 ENCOUNTER — Other Ambulatory Visit (HOSPITAL_COMMUNITY): Payer: Self-pay

## 2023-10-10 ENCOUNTER — Other Ambulatory Visit: Payer: Self-pay | Admitting: Obstetrics

## 2023-10-10 DIAGNOSIS — Z1231 Encounter for screening mammogram for malignant neoplasm of breast: Secondary | ICD-10-CM

## 2023-10-13 ENCOUNTER — Other Ambulatory Visit (HOSPITAL_COMMUNITY): Payer: Self-pay

## 2023-10-14 ENCOUNTER — Other Ambulatory Visit (HOSPITAL_COMMUNITY): Payer: Self-pay

## 2023-10-31 ENCOUNTER — Ambulatory Visit: Payer: Self-pay

## 2023-11-21 ENCOUNTER — Ambulatory Visit: Payer: Self-pay

## 2023-11-24 ENCOUNTER — Other Ambulatory Visit (HOSPITAL_COMMUNITY): Payer: Self-pay

## 2023-11-26 ENCOUNTER — Other Ambulatory Visit (HOSPITAL_COMMUNITY): Payer: Self-pay

## 2023-12-12 ENCOUNTER — Inpatient Hospital Stay
Admission: RE | Admit: 2023-12-12 | Discharge: 2023-12-12 | Discharge status: Home / Self Care | Payer: Self-pay | Source: Ambulatory Visit | Attending: Obstetrics | Admitting: Obstetrics

## 2023-12-12 DIAGNOSIS — Z1231 Encounter for screening mammogram for malignant neoplasm of breast: Secondary | ICD-10-CM

## 2023-12-20 ENCOUNTER — Other Ambulatory Visit (HOSPITAL_COMMUNITY): Payer: Self-pay

## 2023-12-20 ENCOUNTER — Other Ambulatory Visit: Payer: Self-pay

## 2023-12-20 MED ORDER — HYDROCOD POLI-CHLORPHE POLI ER 10-8 MG/5ML PO SUER
5.0000 mL | Freq: Two times a day (BID) | ORAL | 0 refills | Status: AC
  Filled 2023-12-20 – 2023-12-26 (×2): qty 150, 15d supply, fill #0

## 2023-12-20 MED ORDER — SULINDAC 200 MG PO TABS
200.0000 mg | ORAL_TABLET | Freq: Two times a day (BID) | ORAL | 11 refills | Status: AC
  Filled 2023-12-20: qty 60, 30d supply, fill #0
  Filled 2024-04-07: qty 60, 30d supply, fill #1
  Filled 2024-05-04: qty 60, 30d supply, fill #2

## 2023-12-26 ENCOUNTER — Other Ambulatory Visit (HOSPITAL_COMMUNITY): Payer: Self-pay

## 2024-02-28 ENCOUNTER — Other Ambulatory Visit (HOSPITAL_COMMUNITY): Payer: Self-pay

## 2024-03-02 ENCOUNTER — Other Ambulatory Visit (HOSPITAL_BASED_OUTPATIENT_CLINIC_OR_DEPARTMENT_OTHER): Payer: Self-pay

## 2024-03-02 MED ORDER — METHIMAZOLE 5 MG PO TABS
5.0000 mg | ORAL_TABLET | Freq: Every day | ORAL | 3 refills | Status: AC
  Filled 2024-03-02: qty 30, 30d supply, fill #0
  Filled 2024-03-03 – 2024-03-31 (×3): qty 30, 30d supply, fill #1
  Filled 2024-04-27: qty 30, 30d supply, fill #2

## 2024-03-03 ENCOUNTER — Other Ambulatory Visit (HOSPITAL_COMMUNITY): Payer: Self-pay

## 2024-03-31 ENCOUNTER — Other Ambulatory Visit (HOSPITAL_COMMUNITY): Payer: Self-pay

## 2024-04-30 ENCOUNTER — Other Ambulatory Visit (HOSPITAL_COMMUNITY): Payer: Self-pay

## 2024-05-05 ENCOUNTER — Other Ambulatory Visit (HOSPITAL_COMMUNITY): Payer: Self-pay
